# Patient Record
Sex: Male | Born: 2000 | Race: Black or African American | Hispanic: No | Marital: Single | Smoking: Current every day smoker
Health system: Southern US, Community
[De-identification: ages and names within clinical notes are randomized; demographics above are authoritative.]

---
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
## 2022-04-19 ENCOUNTER — Ambulatory Visit (HOSPITAL_COMMUNITY): Payer: Self-pay

## 2022-09-09 ENCOUNTER — Emergency Department (HOSPITAL_BASED_OUTPATIENT_CLINIC_OR_DEPARTMENT_OTHER)
Admission: EM | Admit: 2022-09-09 | Discharge: 2022-09-09 | Payer: Medicaid Other | Attending: Emergency Medicine | Admitting: Emergency Medicine

## 2022-09-09 ENCOUNTER — Encounter (HOSPITAL_BASED_OUTPATIENT_CLINIC_OR_DEPARTMENT_OTHER): Payer: Self-pay

## 2022-09-09 ENCOUNTER — Emergency Department (HOSPITAL_BASED_OUTPATIENT_CLINIC_OR_DEPARTMENT_OTHER): Payer: Medicaid Other

## 2022-09-09 DIAGNOSIS — R0789 Other chest pain: Secondary | ICD-10-CM | POA: Diagnosis not present

## 2022-09-09 DIAGNOSIS — R079 Chest pain, unspecified: Secondary | ICD-10-CM | POA: Diagnosis not present

## 2022-09-09 DIAGNOSIS — F17210 Nicotine dependence, cigarettes, uncomplicated: Secondary | ICD-10-CM | POA: Diagnosis not present

## 2022-09-09 LAB — CBC
HCT: 42.8 % (ref 39.0–52.0)
Hemoglobin: 14.7 g/dL (ref 13.0–17.0)
MCH: 30.9 pg (ref 26.0–34.0)
MCHC: 34.3 g/dL (ref 30.0–36.0)
MCV: 89.9 fL (ref 80.0–100.0)
Platelets: 172 10*3/uL (ref 150–400)
RBC: 4.76 MIL/uL (ref 4.22–5.81)
RDW: 11.7 % (ref 11.5–15.5)
WBC: 6.8 10*3/uL (ref 4.0–10.5)
nRBC: 0 % (ref 0.0–0.2)

## 2022-09-09 LAB — BASIC METABOLIC PANEL
Anion gap: 8 (ref 5–15)
BUN: 10 mg/dL (ref 6–20)
CO2: 24 mmol/L (ref 22–32)
Calcium: 8.9 mg/dL (ref 8.9–10.3)
Chloride: 104 mmol/L (ref 98–111)
Creatinine, Ser: 1.17 mg/dL (ref 0.61–1.24)
GFR, Estimated: >60 mL/min (ref >60)
Glucose, Bld: 96 mg/dL (ref 70–99)
Potassium: 3.3 mmol/L — ABNORMAL LOW (ref 3.5–5.1)
Sodium: 136 mmol/L (ref 135–145)

## 2022-09-09 LAB — TROPONIN I (HIGH SENSITIVITY): Troponin I (High Sensitivity): <2 ng/L (ref <18)

## 2022-09-09 MED ORDER — ONDANSETRON 4 MG PO TBDP
4.0000 mg | ORAL_TABLET | Freq: Once | ORAL | Status: AC | PRN
  Administered 2022-09-09: 4 mg via ORAL
  Filled 2022-09-09: qty 1
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
## 2022-09-09 NOTE — ED Notes (Addendum)
No further vomiting at this time. Tolerating PO. Pt discharged in PD custody
